# Patient Record
Sex: Male | Born: 1980 | Race: White | Hispanic: No | Marital: Single | Smoking: Current every day smoker
Health system: Southern US, Community
[De-identification: ages and names within clinical notes are randomized; demographics above are authoritative.]

## PROBLEM LIST (undated history)

## (undated) HISTORY — PX: HERNIA REPAIR: SHX51

---

## 2013-11-11 ENCOUNTER — Emergency Department (HOSPITAL_COMMUNITY)
Admission: EM | Admit: 2013-11-11 | Discharge: 2013-11-11 | Disposition: A | Discharge status: Home / Self Care | Payer: Self-pay | Attending: Emergency Medicine | Admitting: Emergency Medicine

## 2013-11-11 ENCOUNTER — Emergency Department (HOSPITAL_COMMUNITY): Payer: Self-pay

## 2013-11-11 ENCOUNTER — Encounter (HOSPITAL_COMMUNITY): Payer: Self-pay

## 2013-11-11 DIAGNOSIS — Z720 Tobacco use: Secondary | ICD-10-CM | POA: Insufficient documentation

## 2013-11-11 DIAGNOSIS — Z880 Allergy status to penicillin: Secondary | ICD-10-CM | POA: Insufficient documentation

## 2013-11-11 DIAGNOSIS — R0789 Other chest pain: Secondary | ICD-10-CM

## 2013-11-11 DIAGNOSIS — R0781 Pleurodynia: Secondary | ICD-10-CM | POA: Insufficient documentation

## 2013-11-11 MED ORDER — CYCLOBENZAPRINE HCL 10 MG PO TABS
5.0000 mg | ORAL_TABLET | Freq: Once | ORAL | Status: AC
  Administered 2013-11-11: 5 mg via ORAL
  Filled 2013-11-11: qty 1

## 2013-11-11 MED ORDER — CYCLOBENZAPRINE HCL 5 MG PO TABS: 5.0000 mg | ORAL_TABLET | Freq: Three times a day (TID) | ORAL | Status: AC

## 2013-11-11 MED ORDER — HYDROCODONE-ACETAMINOPHEN 5-325 MG PO TABS: 1.0000 | ORAL_TABLET | Freq: Four times a day (QID) | ORAL | Status: AC | PRN

## 2013-11-11 MED ORDER — HYDROCODONE-ACETAMINOPHEN 5-325 MG PO TABS
1.0000 | ORAL_TABLET | Freq: Once | ORAL | Status: AC
  Administered 2013-11-11: 1 via ORAL
  Filled 2013-11-11: qty 1

## 2013-11-11 NOTE — ED Notes (Signed)
Pt complains of right sided rib pain since this am, no injury noted, pt states that it hurts to breathe

## 2013-11-11 NOTE — ED Provider Notes (Signed)
CSN: [id number]     Arrival date & time 11/11/13  2028 History  This chart was scribed for non-physician practitioner working with Linwood DibblesJon Knapp, MD by Elveria Risingimelie Horne, ED Scribe. This patient was seen in room WTR9/WTR9 and the patient's care was started at 10:13 PM.   Chief Complaint  Patient presents with  . left rib pain    The history is provided by the patient. No language interpreter was used.   HPI Comments: Wayne Mcclain is a 33 y.o. male who presents to the Emergency Department complaining of development of dull left rib pain upon waking up this morning that has progressively worsened throughout the day. Patient reports exacerbation with breathing. Patient denies direct injury or causative trauma. 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 Patient bracing left ribs during examination.   History reviewed. No pertinent past medical history. History reviewed. No pertinent past surgical history. History reviewed. No pertinent family history. History  Substance Use Topics  . Smoking status: Current Every Day Smoker  . Smokeless tobacco: Not on file  . Alcohol Use: Yes    Review of Systems  Constitutional: Negative for fever and chills.  Cardiovascular: Positive for chest pain.  All other systems reviewed and are negative.   Allergies  Penicillins and Ultram  Home Medications   Prior to Admission medications   Medication Sig Start Date End Date Taking? Authorizing Provider  acetaminophen (TYLENOL) 500 MG tablet Take 500 mg by mouth every 6 (six) hours as needed (for  pain.).   Yes Historical Provider, MD  ibuprofen (ADVIL,MOTRIN) 200 MG tablet Take 200 mg by mouth every 6 (six) hours as needed (for pain.).   Yes Historical Provider, MD  cyclobenzaprine (FLEXERIL) 5 MG tablet Take 1 tablet (5 mg total) by mouth 3 (three) times daily. 11/11/13   Arman FilterGail K Tramaine Sauls, NP  HYDROcodone-acetaminophen (NORCO/VICODIN) 5-325 MG per tablet Take 1  tablet by mouth every 6 (six) hours as needed for moderate pain. 11/11/13   Arman FilterGail K Henriette Hesser, NP   Triage Vitals: BP 145/79 mmHg  Pulse 100  Temp(Src) 98.5 F (36.9 C) (Oral)  Resp 14  SpO2 98%   Physical Exam  Constitutional: He is oriented to person, place, and time. He appears well-developed and well-nourished. No distress.  HENT:  Head: Normocephalic and atraumatic.  Eyes: EOM are normal.  Neck: Neck supple. No tracheal deviation present.  Cardiovascular: Normal rate.   Pulmonary/Chest: Effort normal. No respiratory distress. He exhibits tenderness.  Musculoskeletal: Normal range of motion.  Neurological: He is alert and oriented to person, place, and time.  Skin: Skin is warm and dry.  Psychiatric: He has a normal mood and affect. His behavior is normal.  Nursing note and vitals reviewed.   ED Course  Procedures (including critical care time)  COORDINATION OF CARE: 10:15 PM- Patient advised to avoid heavy lifting if possible given his profession. Discussed treatment plan with patient at bedside and patient agreed to plan.   Labs Review Labs Reviewed - No data to display  Imaging Review Dg Chest 2 View  11/11/2013   CLINICAL DATA:  Left-sided chest pain beginning this morning. History of smoking.  EXAM: CHEST  2 VIEW  COMPARISON:  None.  FINDINGS: Heart, mediastinum hila are unremarkable.  There are prominent bronchovascular markings. No lung consolidation or edema. No lung mass or discrete nodule. No pleural effusion or pneumothorax.  Bony thorax is intact.  IMPRESSION: No active cardiopulmonary disease.  Electronically Signed   By: Amie Portlandavid  Ormond M.D.   On: 11/11/2013 21:17     EKG Interpretation None     Chest xray reviewed  No fracture will RX flexeril and Vicodin  MDM   Final diagnoses:  Rib pain on left side       I personally performed the services described in this documentation, which was scribed in my presence. The recorded information has been reviewed  and is accurate.    Arman FilterGail K Ryer Asato, NP 11/11/13 2224

## 2013-11-11 NOTE — Discharge Instructions (Signed)
Chest Wall Pain °Chest wall pain is pain in or around the bones and muscles of your chest. It may take up to 6 weeks to get better. It may take longer if you must stay physically active in your work and activities.  °CAUSES  °Chest wall pain may happen on its own. However, it may be caused by: °· A viral illness like the flu. °· Injury. °· Coughing. °· Exercise. °· Arthritis. °· Fibromyalgia. °· Shingles. °HOME CARE INSTRUCTIONS  °· Avoid overtiring physical activity. Try not to strain or perform activities that cause pain. This includes any activities using your chest or your abdominal and side muscles, especially if heavy weights are used. °· Put ice on the sore area. °¨ Put ice in a plastic bag. °¨ Place a towel between your skin and the bag. °¨ Leave the ice on for 15-20 minutes per hour while awake for the first 2 days. °· Only take over-the-counter or prescription medicines for pain, discomfort, or fever as directed by your caregiver. °SEEK IMMEDIATE MEDICAL CARE IF:  °· Your pain increases, or you are very uncomfortable. °· You have a fever. °· Your chest pain becomes worse. °· You have new, unexplained symptoms. °· You have nausea or vomiting. °· You feel sweaty or lightheaded. °· You have a cough with phlegm (sputum), or you cough up blood. °MAKE SURE YOU:  °· Understand these instructions. °· Will watch your condition. °· Will get help right away if you are not doing well or get worse. °Document Released: 12/27/2004 Document Revised: 03/21/2011 Document Reviewed: 08/23/2010 °ExitCare® Patient Information ©2015 [employer]. This information is not intended to replace advice given to you by your health care provider. Make sure you discuss any questions you have with your health care provider. ° ° °Emergency Department Resource Guide °1) Find a Doctor and Pay Out of Pocket °Although you won't have to find out who is covered by your insurance plan, it is a good idea to ask around and get recommendations. You  will then need to call the office and see if the doctor you have chosen will accept you as a new patient and what types of options they offer for patients who are self-pay. Some doctors offer discounts or will set up payment plans for their patients who do not have insurance, but you will need to ask so you aren't surprised when you get to your appointment. ° °2) Contact Your Local Health Department °Not all health departments have doctors that can see patients for sick visits, but many do, so it is worth a call to see if yours does. If you don't know where your local health department is, you can check in your phone book. The CDC also has a tool to help you locate your state's health department, and many state websites also have listings of all of their local health departments. ° °3) Find a Walk-in Clinic °If your illness is not likely to be very severe or complicated, you may want to try a walk in clinic. These are popping up all over the country in pharmacies, drugstores, and shopping centers. They're usually staffed by nurse practitioners or physician assistants that have been trained to treat common illnesses and complaints. They're usually fairly quick and inexpensive. However, if you have serious medical issues or chronic medical problems, these are probably not your best option. ° °No Primary Care Doctor: °- Call [facility] at  832-8000 - they can help you locate a primary care doctor that    accepts your insurance, provides certain services, etc. °- Physician Referral Service- 1-800-533-3463 ° °Chronic Pain Problems: °Organization         Address  Phone   Notes  °[facility]  (336) 297-2271 Patients need to be referred by their primary care doctor.  ° °Medication Assistance: °Organization         Address  Phone   Notes  °[place] [facility] [address] °[place], [place] [address] (336) 641-8030 --Must be a resident of [place] °-- Must  have NO insurance coverage whatsoever (no Medicaid/ Medicare, etc.) °-- The pt. MUST have a primary care doctor that directs their care regularly and follows them in the community °  °[facility]  (866) 331-1348   °[employer]  (888) 892-1162   ° °Agencies that provide inexpensive medical care: °Organization         Address  Phone   Notes  °[facility]  (336) 832-8035   °[facility]    (336) 832-7272   °[facility] [address] °[place], [place] [address] (336) 832-4777   °[facility] [address], °[place] (336) 271-4999   °[facility]    (336) 373-0678   °[facility]    (336) 272-1050   °[facility] ° [address], [place] Phone:  (336) 832-4444, Fax:  (336) 832-4440 Hours of Operation:  9 am - 6 pm, M-F.  Also accepts Medicaid/Medicare and self-pay.  °[facility] ° [address], [place] Phone: (336) 832-3150, Fax: (336) 832-3151. Hours of Operation:  8:30 am - 5:30 pm, M-F.  Also accepts Medicaid and self-pay.  °[facility] [place] [address], [place] Phone: (336) 878-6027   °[facility] [address], [place], [place] (336)723-1848, Ext. 123 Mondays & Thursdays: 7-9 AM.  First 15 patients are seen on a first come, first serve basis. °  ° °Medicaid-accepting [place] Providers: ° °Organization         Address  Phone   Notes  °[facility] [address], [place] (336) 641-2100 Also accepts self-pay patients.  °[facility] [address], [place] ° (336) 856-9996   °[facility] [address], [place] (336) 288-8857   °[facility] [address] [place] [address], [place] (336) 299-7000   °Veita Bland [address], [place]  ° (336) 373-1557 Only accepts [place] Access Medicaid patients after  they have their name applied to their card.  ° °Self-Pay (no insurance) in [place]: ° °Organization         Address  Phone   Notes  °Sickle Cell Patients, [facility] [address], [place] (336) 832-1970   °[facility] [address], [place] (336) 832-4400   °[facility] ° [address], [place] (336) 992-4800   °[facility]/Dr. Osei-Bonsu ° [address] [place] [address], [place] or [address], [place] (336) 841-8500 Phone number for both [place] and [place] locations is the same.  °[facility] [address], [place] (336) 299-0000   °[facility] [address] [place] [address], [place] or [address] (336) 852-7530 °(336) 878-2260   °[facility] [address], [place] (336) 350-1642, phone; (336) 294-5005, fax Sees patients 1st and 3rd Saturday of every month.  Must not qualify for public or private insurance (i.e. Medicaid, Medicare, [place] Health Choice, Veterans' Benefits) • Household income should be no more than 200% of the poverty level •The clinic cannot treat you if you are pregnant or think you are pregnant • Sexually transmitted diseases are not treated at the clinic.  ° ° °Dental Care: °Organization         Address  Phone  Notes  °[facility] [address], [place] (336) 641-6152 Accepts children up to age 21 who are enrolled in Medicaid or [place] Health Choice; pregnant women with a Medicaid card; and children who have applied for Medicaid or [place] Health Choice, but were declined, whose parents can pay a reduced fee at time of service.  °[facility] [place]  [address], [place] (336) 641-7733 Accepts children up to age 21 who are enrolled in Medicaid or [place] Health Choice; pregnant women with a Medicaid card; and children who  have applied for Medicaid or [place] Health Choice, but were declined, whose parents can pay a reduced fee at time of service.  °[facility] ° [address], [place] (336) 641-4533 Patients are seen by appointment only. Walk-ins are not accepted. [facility] will see patients 18 years of age and older. °Monday - Tuesday (8am-5pm) °Most Wednesdays (8:30-5pm) °$30 per visit, cash only  °[facility] ° [address], [place] (336) 641-4533 Patients are seen by appointment only. Walk-ins are not accepted. [facility] will see patients 18 years of age and older. °One Wednesday Evening (Monthly: Volunteer Based).  $30 per visit, cash only  °[facility]  (919) 537-3737 for adults; Children under age 4, call Graduate Pediatric Dentistry at (919) [phone]. Children aged 4-14, please call (919) 537-3737 to request a pediatric application. ° Dental services are provided in all areas of dental care including fillings, crowns and bridges, complete and partial dentures, implants, gum treatment, root canals, and extractions. Preventive care is also provided. Treatment is provided to both adults and children. °Patients are selected via a lottery and there is often a waiting list. °  °[facility] [address], °[place] ° (336) 763-8833 [email or web address] °  °[facility] [address], [place], [place] (336)723-1848, Ext. 123 Second and Fourth Thursday of each month, opens at 6:30 AM; Clinic ends at 9 AM.  Patients are seen on a first-come first-served basis, and a limited number are seen during each clinic.  ° °[facility] ° [address], [place], [place] (336) 723-7904   Eligibility Requirements °You must have lived in [place], [place], or [place] counties for at least the last three months. °  You cannot be eligible for state or federal sponsored healthcare insurance, including Veterans  Administration, Medicaid, or Medicare. °  You generally cannot be eligible for healthcare insurance through your employer.  °  How to apply: °Eligibility screenings are held every Tuesday and Wednesday afternoon from 1:00 pm until 4:00 pm. You do not need an appointment for the interview!  °[facility] [address], [place], [place] 336-631-2330   °[facility]  336-342-8273   °[place]  336-703-3100   ° [facility]    336-570-6415   ° °Behavioral Health Resources in the Community: °Intensive Outpatient Programs °Organization         Address  Phone  Notes  °[facility] [address], [place], [place] 336-878-6098   °[facility] [address], [place], [place] 336-832-9800   °[facility] [address], [place], [place] ° 336-882-2125   °[facility] [address],  °[place], [place] 1-800-853-5163 or 336-641-4981   °Substance Abuse Resources °Organization         Address  Phone  Notes  °[facility]  336-882-2125   °[facility]  336-784-9470   °[facility]  336-285-9073   °[facility]  336-845-3988   °Residential & Outpatient Substance Abuse Program  1-800-659-3381   °Psychological Services °Organization         Address  Phone  Notes  °[facility]  336- 832-9600   °[facility]  336- 378-7881   °[facility] [address], [place] 1-800-853-5163 or 336-641-4981   ° °Mobile Crisis Teams °Organization         Address  Phone  Notes  °[facility], Mobile Crisis Care Unit  1-877-626-1772   °Assertive °[facility] ° [address] [place], [place] 336-834-9664   °Sharon DeEsch [address] °[place] 336-554-5454   ° °Self-Help/Support Groups °Organization         Address  Phone             Notes  °[facility] -  variety of support groups  336- 373-1402 Call for more information  °Narcotics Anonymous (NA), [facility] [address], °[place]  2 meetings at this location  ° °Residential Treatment Programs °Organization         Address  Phone  Notes  °[facility] [address],    °[place]  1-866-801-8205   °[facility] ° [address], [place], [place] 704-293-8524   °[facility] [address], [place] 336-845-3988 Admissions: 8am-3pm M-F  °[facility] [address]    °[place], [place] 336-841-1104   °[facility] [address], [place], [place] 336-379-7146   °[facility] [address]  °[place], [place] 336-285-9073   °[facility] Intensive Outpatient [address], [place], [place] 336-852-3033   °[facility] [address]  °[place], [place] 1-877-615-2722 or 336-784-9470   °[facility] [address] [place], [place] 336-227-7417 Accepts Medicaid  °[facility] [address]  °[place] 1-800-659-3381 Substance Abuse/Addiction Treatment  ° °[place] Behavioral Health Resources °Organization         Address  Phone  Notes  °[facility]  (888) 581-9988   °Julie Brannon, PhD [address] [place], [place]   (336) 349-5553 or (336) 951-0000   °[facility]   [address] °[place], [place] (336) 349-4454   °[facility] [address], [place],  (336) 342-8316 Insurance/Medicaid/sponsorship through [facility]  °[facility] [address]                                    [place],  (336) 342-8316 Therapy/tele-psych/case  °[facility] [address]  ° [place], [place] (336) 349-2233    °Dr. Arfeen  (336) 349-4544   °[facility] [place]  [employer] [place] [facility] 1) [address], [place] °2) [address], [place] °3)  [address], [place] (336) 349-3220 °(336) 342-7768 ° °(336) 342-8140   °[place] [facility] (336) 342-1394 or (336) 342-3537 (After Hours)    ° ° ° °

## 2013-11-25 ENCOUNTER — Emergency Department (HOSPITAL_COMMUNITY)
Admission: EM | Admit: 2013-11-25 | Discharge: 2013-11-25 | Disposition: A | Discharge status: Home / Self Care | Payer: Self-pay | Attending: Emergency Medicine | Admitting: Emergency Medicine

## 2013-11-25 ENCOUNTER — Encounter (HOSPITAL_COMMUNITY): Payer: Self-pay | Admitting: Emergency Medicine

## 2013-11-25 DIAGNOSIS — M5442 Lumbago with sciatica, left side: Secondary | ICD-10-CM

## 2013-11-25 DIAGNOSIS — Z79899 Other long term (current) drug therapy: Secondary | ICD-10-CM | POA: Insufficient documentation

## 2013-11-25 DIAGNOSIS — Z720 Tobacco use: Secondary | ICD-10-CM | POA: Insufficient documentation

## 2013-11-25 DIAGNOSIS — Z880 Allergy status to penicillin: Secondary | ICD-10-CM | POA: Insufficient documentation

## 2013-11-25 MED ORDER — HYDROCODONE-ACETAMINOPHEN 5-325 MG PO TABS
1.0000 | ORAL_TABLET | Freq: Once | ORAL | Status: AC
  Administered 2013-11-25: 1 via ORAL
  Filled 2013-11-25: qty 1

## 2013-11-25 MED ORDER — HYDROCODONE-ACETAMINOPHEN 5-325 MG PO TABS: 1.0000 | ORAL_TABLET | ORAL | Status: AC | PRN
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
## 2013-11-25 NOTE — Discharge Instructions (Signed)
Back Pain, Adult °Back pain is very common. The pain often gets better over time. The cause of back pain is usually not dangerous. Most people can learn to manage their back pain on their own.  °HOME CARE  °· Stay active. Start with short walks on flat ground if you can. Try to walk farther each day. °· Do not sit, drive, or stand in one place for more than 30 minutes. Do not stay in bed. °· Do not avoid exercise or work. Activity can help your back heal faster. °· Be careful when you bend or lift an object. Bend at your knees, keep the object close to you, and do not twist. °· Sleep on a firm mattress. Lie on your side, and bend your knees. If you lie on your back, put a pillow under your knees. °· Only take medicines as told by your doctor. °· Put ice on the injured area. °¨ Put ice in a plastic bag. °¨ Place a towel between your skin and the bag. °¨ Leave the ice on for 15-20 minutes, 03-04 times a day for the first 2 to 3 days. After that, you can switch between ice and heat packs. °· Ask your doctor about back exercises or massage. °· Avoid feeling anxious or stressed. Find good ways to deal with stress, such as exercise. °GET HELP RIGHT AWAY IF:  °· Your pain does not go away with rest or medicine. °· Your pain does not go away in 1 week. °· You have new problems. °· You do not feel well. °· The pain spreads into your legs. °· You cannot control when you poop (bowel movement) or pee (urinate). °· Your arms or legs feel weak or lose feeling (numbness). °· You feel sick to your stomach (nauseous) or throw up (vomit). °· You have belly (abdominal) pain. °· You feel like you may pass out (faint). °MAKE SURE YOU:  °· Understand these instructions. °· Will watch your condition. °· Will get help right away if you are not doing well or get worse. °Document Released: 06/15/2007 Document Revised: 03/21/2011 Document Reviewed: 04/30/2013 °[employer]® Patient Information ©2015 [employer]. This information is not intended  to replace advice given to you by your health care provider. Make sure you discuss any questions you have with your health care provider. ° °

## 2013-11-25 NOTE — ED Notes (Signed)
Pt reports they are leaving with ALL belongings they arrived with. Pt ambulatory, alert, and oriented upon DC.

## 2015-10-21 IMAGING — CR DG CHEST 2V
2 series · 2 of 2 positions shown · non-contrast
Comparison: None.

CLINICAL DATA: Left-sided chest pain beginning this morning.
History of smoking.

EXAM:
CHEST  2 VIEW

[w chest pa]
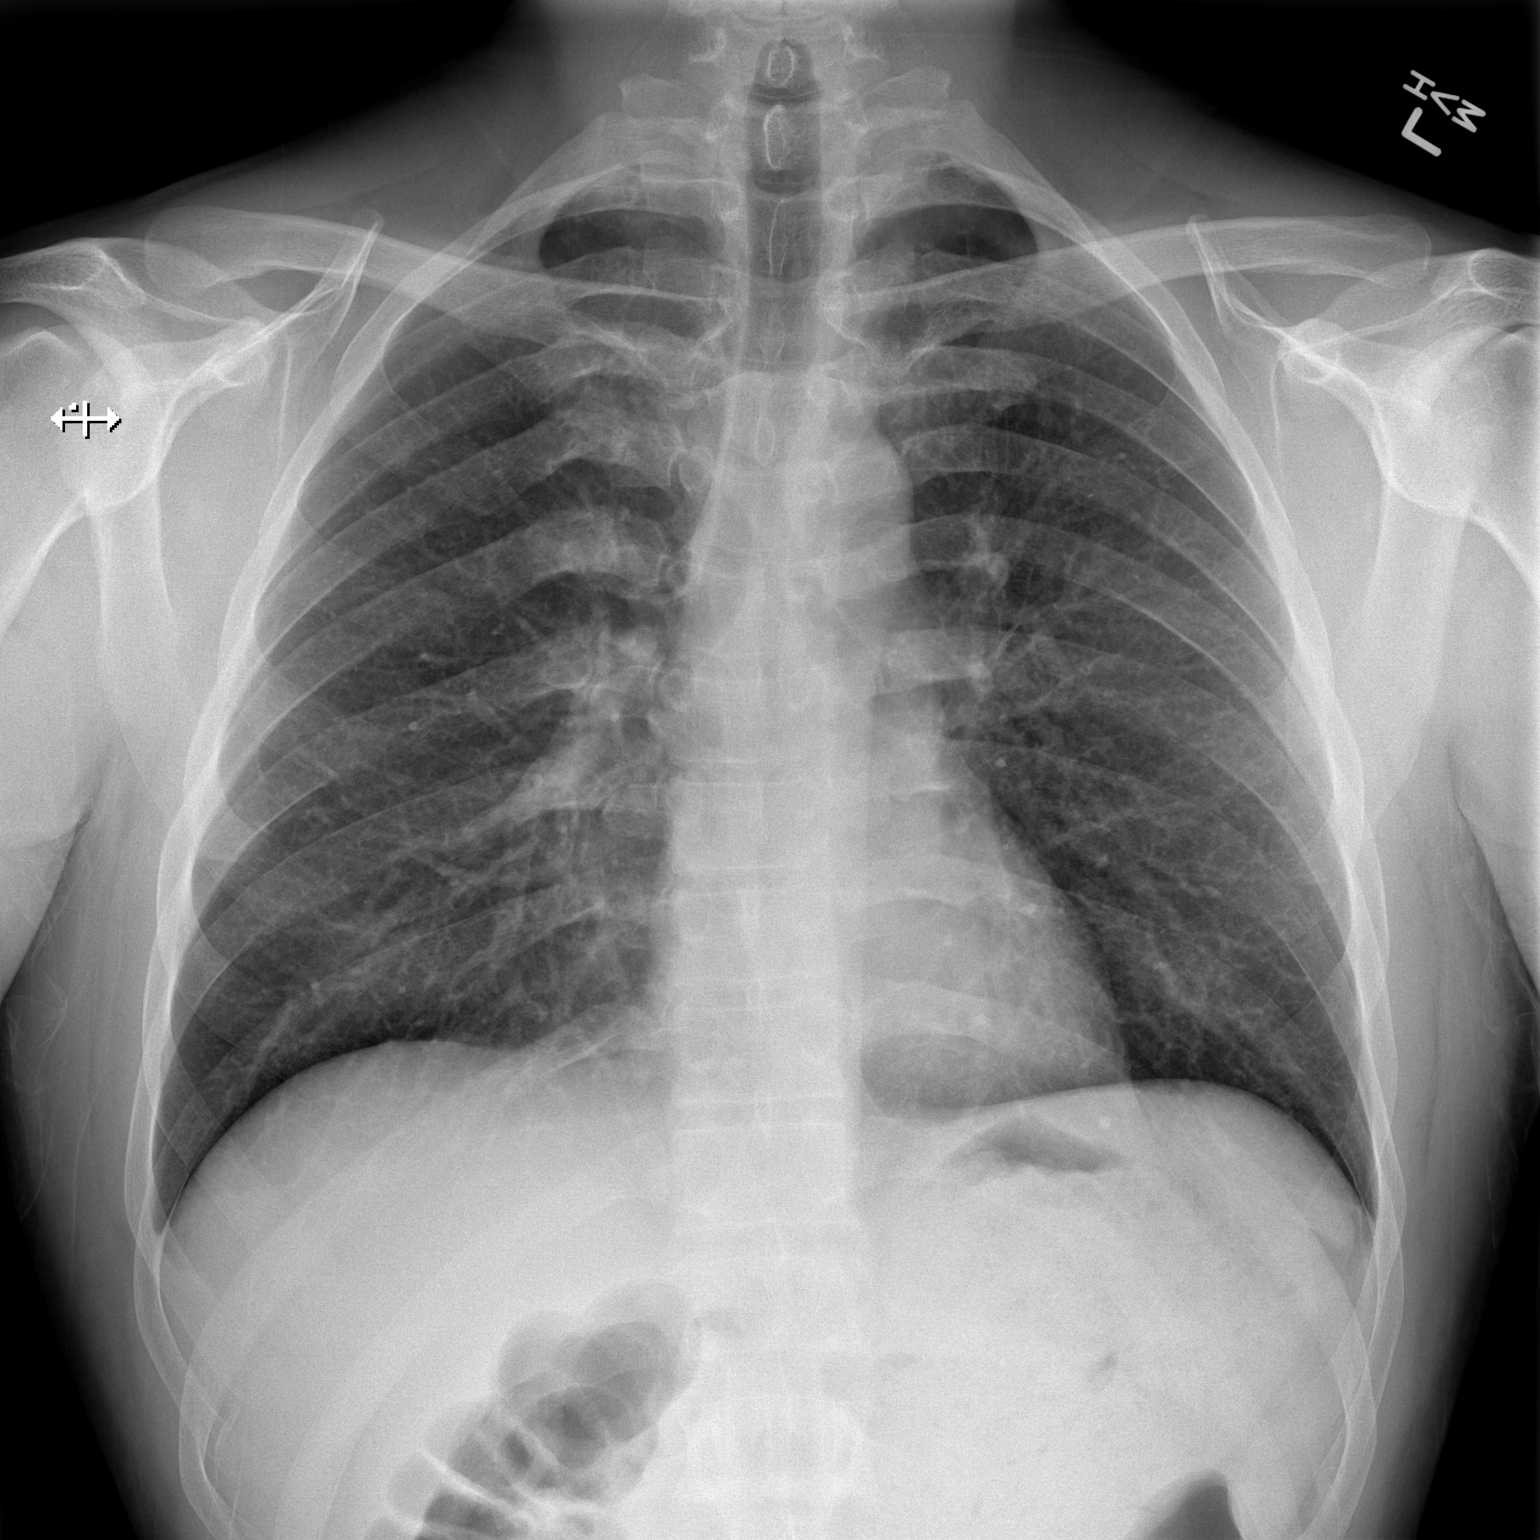

[w chest lat]
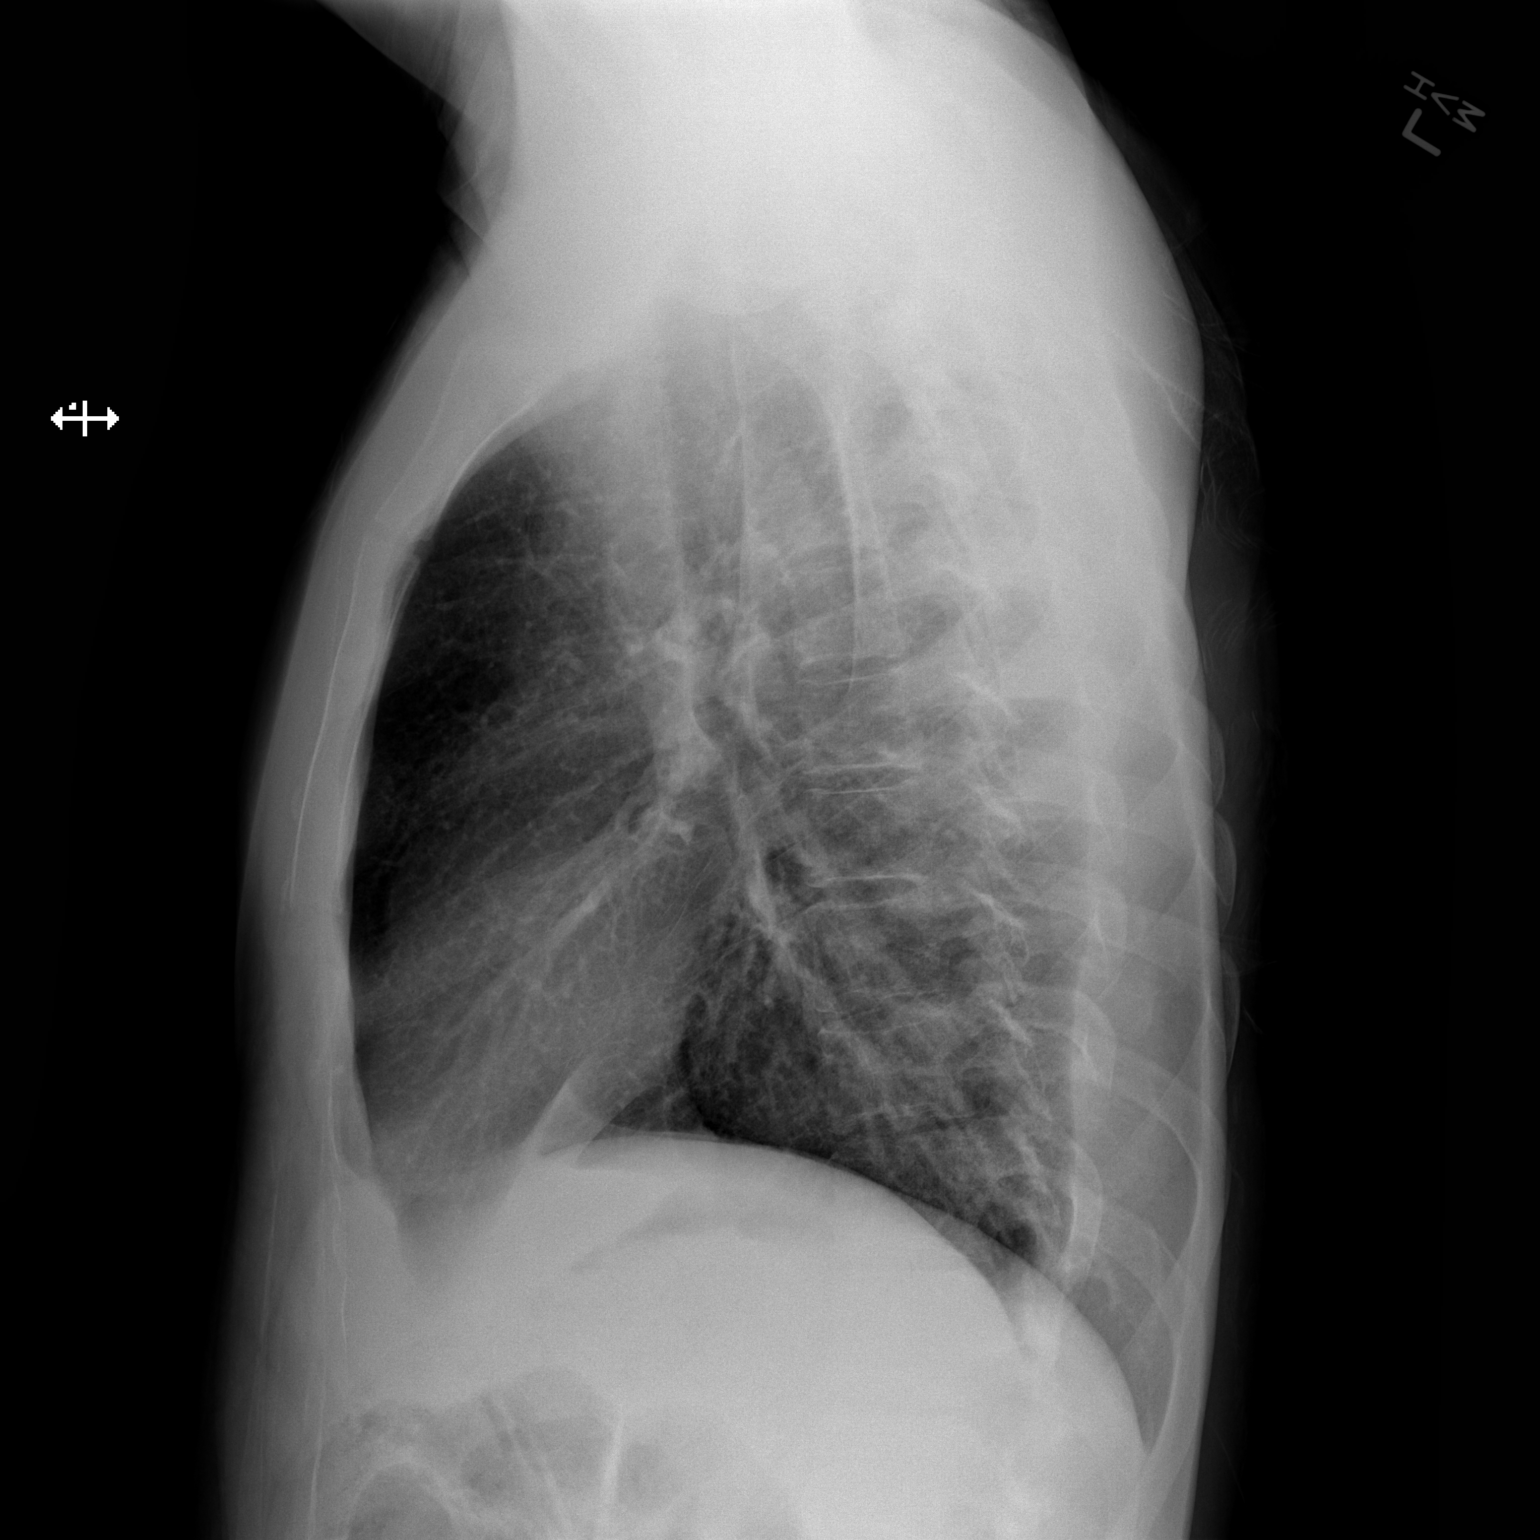

[2 of 2 positions shown; findings below may reference images not displayed]

FINDINGS: Heart, mediastinum hila are unremarkable.

There are prominent bronchovascular markings. No lung consolidation
or edema. No lung mass or discrete nodule. No pleural effusion or
pneumothorax.

Bony thorax is intact.
IMPRESSION: No active cardiopulmonary disease.
